# Patient Record
Sex: Male | Born: 1960 | Race: White | Hispanic: No | Smoking: Never smoker
Health system: Southern US, Community
[De-identification: ages and names within clinical notes are randomized; demographics above are authoritative.]

## PROBLEM LIST (undated history)

## (undated) DIAGNOSIS — G4700 Insomnia, unspecified: Secondary | ICD-10-CM

## (undated) DIAGNOSIS — G8929 Other chronic pain: Secondary | ICD-10-CM

## (undated) DIAGNOSIS — R531 Weakness: Secondary | ICD-10-CM

## (undated) DIAGNOSIS — K219 Gastro-esophageal reflux disease without esophagitis: Secondary | ICD-10-CM

## (undated) DIAGNOSIS — I10 Essential (primary) hypertension: Secondary | ICD-10-CM

## (undated) DIAGNOSIS — R51 Headache: Secondary | ICD-10-CM

## (undated) DIAGNOSIS — Z8709 Personal history of other diseases of the respiratory system: Secondary | ICD-10-CM

## (undated) DIAGNOSIS — F32A Depression, unspecified: Secondary | ICD-10-CM

## (undated) DIAGNOSIS — M549 Dorsalgia, unspecified: Secondary | ICD-10-CM

## (undated) DIAGNOSIS — M542 Cervicalgia: Secondary | ICD-10-CM

## (undated) DIAGNOSIS — E785 Hyperlipidemia, unspecified: Secondary | ICD-10-CM

## (undated) DIAGNOSIS — F329 Major depressive disorder, single episode, unspecified: Secondary | ICD-10-CM

---

## 2013-11-11 ENCOUNTER — Encounter (HOSPITAL_COMMUNITY): Payer: Self-pay | Admitting: Pharmacy Technician

## 2013-11-15 ENCOUNTER — Encounter (HOSPITAL_COMMUNITY)
Admission: RE | Admit: 2013-11-15 | Discharge: 2013-11-15 | Disposition: A | Discharge status: Home / Self Care | Payer: Self-pay | Source: Ambulatory Visit | Attending: Anesthesiology | Admitting: Anesthesiology

## 2013-11-15 ENCOUNTER — Encounter (HOSPITAL_COMMUNITY): Payer: Self-pay

## 2013-11-15 ENCOUNTER — Encounter (HOSPITAL_COMMUNITY)
Admission: RE | Admit: 2013-11-15 | Discharge: 2013-11-15 | Disposition: A | Discharge status: Home / Self Care | Payer: Worker's Compensation | Source: Ambulatory Visit | Attending: Orthopedic Surgery | Admitting: Orthopedic Surgery

## 2013-11-15 DIAGNOSIS — Z01811 Encounter for preprocedural respiratory examination: Secondary | ICD-10-CM | POA: Insufficient documentation

## 2013-11-15 DIAGNOSIS — Z01812 Encounter for preprocedural laboratory examination: Secondary | ICD-10-CM | POA: Insufficient documentation

## 2013-11-15 DIAGNOSIS — Z01810 Encounter for preprocedural cardiovascular examination: Secondary | ICD-10-CM | POA: Insufficient documentation

## 2013-11-15 DIAGNOSIS — Z01818 Encounter for other preprocedural examination: Secondary | ICD-10-CM | POA: Insufficient documentation

## 2013-11-15 HISTORY — DX: Other chronic pain: G89.29

## 2013-11-15 HISTORY — DX: Hyperlipidemia, unspecified: E78.5

## 2013-11-15 HISTORY — DX: Depression, unspecified: F32.A

## 2013-11-15 HISTORY — DX: Major depressive disorder, single episode, unspecified: F32.9

## 2013-11-15 HISTORY — DX: Personal history of other diseases of the respiratory system: Z87.09

## 2013-11-15 HISTORY — DX: Headache: R51

## 2013-11-15 HISTORY — DX: Cervicalgia: M54.2

## 2013-11-15 HISTORY — DX: Insomnia, unspecified: G47.00

## 2013-11-15 HISTORY — DX: Gastro-esophageal reflux disease without esophagitis: K21.9

## 2013-11-15 HISTORY — DX: Weakness: R53.1

## 2013-11-15 HISTORY — DX: Dorsalgia, unspecified: M54.9

## 2013-11-15 HISTORY — DX: Essential (primary) hypertension: I10

## 2013-11-15 LAB — CBC
HCT: 45 % (ref 39.0–52.0)
Hemoglobin: 16.7 g/dL (ref 13.0–17.0)
MCH: 34.4 pg — AB (ref 26.0–34.0)
MCHC: 37.1 g/dL — ABNORMAL HIGH (ref 30.0–36.0)
MCV: 92.8 fL (ref 78.0–100.0)
PLATELETS: 186 10*3/uL (ref 150–400)
RBC: 4.85 MIL/uL (ref 4.22–5.81)
RDW: 12.2 % (ref 11.5–15.5)
WBC: 3.3 10*3/uL — ABNORMAL LOW (ref 4.0–10.5)

## 2013-11-15 LAB — BASIC METABOLIC PANEL
BUN: 8 mg/dL (ref 6–23)
CALCIUM: 9.8 mg/dL (ref 8.4–10.5)
CO2: 27 mEq/L (ref 19–32)
Chloride: 99 mEq/L (ref 96–112)
Creatinine, Ser: 0.75 mg/dL (ref 0.50–1.35)
GFR calc non Af Amer: >90 mL/min (ref >90)
GLUCOSE: 106 mg/dL — AB (ref 70–99)
Potassium: 5.2 mEq/L (ref 3.7–5.3)
Sodium: 138 mEq/L (ref 137–147)

## 2013-11-15 LAB — SURGICAL PCR SCREEN
MRSA, PCR: NEGATIVE
Staphylococcus aureus: NEGATIVE

## 2013-11-15 NOTE — Pre-Procedure Instructions (Signed)
Caleb Marshall  11/15/2013   Your procedure is scheduled on:  Thurs, Feb 5 @ 7:30 AM  [facility]  at 5:30 AM.  Call this number if you have problems the morning of surgery: [phone]   Remember:   Do not eat food or drink liquids after midnight.   Take these medicines the morning of surgery with A SIP OF WATER: Amlodipine-Benazepril(Lotrel),Wellbutrin(Bupropion),Citalopram(Celexa),and Omeprazole(Prilosec)              Stop taking your Ibuprofen. No Goody's,BC's,Aspirin,Aleve,Fish Oil,or any Herbal Medications   Do not wear jewelry  Do not wear lotions, powders, or colognes. You may wear deodorant.  Men may shave face and neck.  Do not bring valuables to the hospital.  [facility] is not responsible                  for any belongings or valuables.               Contacts, dentures or bridgework may not be worn into surgery.  Leave suitcase in the car. After surgery it may be brought to your room.  For patients admitted to the hospital, discharge time is determined by your                treatment team.                Special Instructions: Shower using CHG 2 nights before surgery and the night before surgery.  If you shower the day of surgery use CHG.  Use special wash - you have one bottle of CHG for all showers.  You should use approximately 1/3 of the bottle for each shower.   Please read over the following fact sheets that you were given: Pain Booklet, Coughing and Deep Breathing, MRSA Information and Surgical Site Infection Prevention

## 2013-11-15 NOTE — Progress Notes (Signed)
11/15/13 1144  OBSTRUCTIVE SLEEP APNEA  Have you ever been diagnosed with sleep apnea through a sleep study? No  Do you snore loudly (loud enough to be heard through closed doors)?  1  Do you often feel tired, fatigued, or sleepy during the daytime? 0  Has anyone observed you stop breathing during your sleep? 1  Do you have, or are you being treated for high blood pressure? 1  BMI more than 35 kg/m2? 0  Age over 51 years old? 1  Neck circumference greater than 40 cm/18 inches? 0 (17)  Gender: 1  Obstructive Sleep Apnea Score 5  Score 4 or greater  Results sent to PCP

## 2013-11-15 NOTE — Progress Notes (Signed)
Pt doesn't have a cardiologist  Denies ever having an echo/stress test/heart cath  Medical Md is Dr.Gina Drue SecondSnider  Denies EKG or CXR in past yr

## 2013-11-18 NOTE — Progress Notes (Signed)
Anesthesia Chart Review:  Patient is a 53 year old male scheduled for C5-7 ACDF on 11/21/13 by Dr. Shon BatonBrooks.  History includes non-smoker, HTN, HLD, GERD, depression, chronic neck and back pain with hand numbness and LLE tingling, headaches. OSA screening score was a 5. PCP is Bettye BoeckGina Snider, PA-C with [facility].    EKG on 11/15/13 showed SR with first degree AVB, septal infarct (age undetermined).  Q waves in lead III. The interpreting cardiologist wrote that no significant change was found; however, I was unable to locate a comparison EKG in [place], [facility], or at [facility]. He denied prior cardiac testing such as an echo, stress, or cath. No CV symptoms were documented at his PAT visit. He has no reported MI/CHF or DM history.  Preoperative CXR and labs noted.  He will be further evaluated by his assigned anesthesiologist on the day of surgery.  If he remains asymptomatic from a CV standpoint then I would anticipate that he could proceed as planned.  Velna Ochsllison Haillie Radu, PA-C [facility]Anesthesiology Phone [phone] 11/18/2013 1:41 PM

## 2013-11-19 NOTE — H&P (Signed)
History of Present Illness  The patient is a 53 year old male who presents today for follow up of their neck. The patient is being followed for their central (bilateral posterior sides) neck pain. They are 6 month(s) ( and 1/2 months psot injury on 04/27/13) out. Symptoms reported today include: pain (headaches), pain at night, aching, throbbing, stiffness, difficulty arising from chair, pain with overhead motions, weakness, numbness (bilateral hands), burning (bilateral arms), pain with lying and pain with lifting. The patient feels that they are doing poorly and report their pain level to be 5 / 10. Current treatment includes: NSAIDs (Aleve). The following medication has been used for pain control: Tylenol (or Aleve as needed).   Caleb Marshall is a 53 year old white male who is here for neck and left arm pain. Caleb Marshall returns today stating Caleb Marshall symptoms are unchanged from Caleb Marshall previous visit. Caleb Marshall is wanting to proceed with C5-C7 fusion scheduled for next week.     Allergies No Known Drug Allergies. 07/05/2013    Family History Cancer. grandfather mothers side Cerebrovascular Accident. grandmother fathers side Diabetes Mellitus. mother and grandmother mothers side Heart Disease. grandfather mothers side Hypertension. mother, grandmother mothers side and grandfather mothers side    Social History Alcohol use. current drinker; only occasionally per week Children. 3 Current work status. working full time Drug/Alcohol Rehab (Currently). no Drug/Alcohol Rehab (Previously). no Exercise. Exercises rarely Illicit drug use. no Living situation. live alone Marital status. married Pain Contract. no Tobacco / smoke exposure. yes Tobacco use. Never smoker. never smoker    Medication History Atorvastatin Calcium (10MG  Tablet, 1 Oral daily) Active. Citalopram Hydrobromide (40MG  Tablet, 1 Oral daily) Active. Amlodipine Besy-Benazepril HCl (5-20MG  Capsule, 1 Oral  daily) Active. Aleve (220MG  Tablet, 1 Oral as needed) Active. Medications Reconciled.    Vitals 11/15/2013 9:12 AM Weight: 206 lb Height: 70.25 in Body Surface Area: 2.15 m Body Mass Index: 29.35 kg/m Temp.: 98.6 FPulse: 86 (Regular) BP: 133/85 (Sitting, Left Arm, Standard)     Objective Transcription  Caleb Marshall is a pleasant white male. Alert and oriented times three. No acute distress. Head is normocephalic, atraumatic. PERRLA. EOMI. Cervical spine has a good range of motion but with discomfort. Lungs are CTA bilaterally. No wheezing is noted. Heart RRR. No murmurs. Abdomen is round and non-distended. NBS times four. Soft and nontender. Gait is normal.  RADIOGRAPHS:  MRI from 07/05/13 demonstrates no cord signal change. There is motion degredation due to artifact but Caleb Marshall has predominant disease at the C5-6, C6-7 and to a lesser extent C4-5 levels. Caleb Marshall has mild central stenosis at C4-5 with foraminal compromise. Severe bilateral neuroforaminal narrowing at C5-6 and moderate to severe bilateral neuroforaminal narrowing at C6-7 causing irritation to the C5-6, C6-7 nerve roots.   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 Caleb Marshall has failed conservative treatment.  We will proceed with C5-C7 ACDF as scheduled. The surgical procedure as well as potential rehab/ recovery time discussed. All questions were encouraged and answered.  I have had an opportunity to review Caleb Marshall studies with him and review the case. We will be planning on a two level ACDF. Caleb Marshall clinical exam is unchanged. Caleb Marshall continues to have significant neck and radicular arm pain which is now bilateral with the left side being more prominent than the right. Caleb Marshall has an excellent range of motion of Caleb Marshall shoulder, elbow and wrist. Caleb Marshall has numbness and dysesthesia in the C6 & C7 distribution on the  left side. Symmetrical DTR's. Negative motor deficits  but Caleb Marshall does have pain inhibition.  At this point in time, we have gone over the risks of an ACDF to include infection, bleeding, nerve damage, death , stroke, paralysis, failure to heal, need for further surgery, ongoing or worse pain, adjacent segment disease, swallowing difficulties, throat pain, hoarseness in the voice. Because this is a multi-level procedure we will be using an external bone stimulator after surgery for a period of 9-12 months. We will use a hard collar for two weeks and then transition to a soft collar.    Caleb Marshall was present for the dictation. All of Caleb Marshall questions were addressed.

## 2013-11-20 MED ORDER — CEFAZOLIN SODIUM-DEXTROSE 2-3 GM-% IV SOLR
2.0000 g | INTRAVENOUS | Status: AC
Start: 2013-11-20 — End: 2013-11-21
  Administered 2013-11-21: 2 g via INTRAVENOUS
  Filled 2013-11-20: qty 50

## 2013-11-20 MED ORDER — ACETAMINOPHEN 10 MG/ML IV SOLN
1000.0000 mg | Freq: Four times a day (QID) | INTRAVENOUS | Status: DC
  Administered 2013-11-21: 1000 mg via INTRAVENOUS
  Filled 2013-11-20: qty 100

## 2013-11-20 MED ORDER — DEXAMETHASONE SODIUM PHOSPHATE 4 MG/ML IJ SOLN
4.0000 mg | Freq: Once | INTRAMUSCULAR | Status: AC
  Administered 2013-11-21: 4 mg via INTRAVENOUS
  Filled 2013-11-20: qty 1

## 2013-11-21 ENCOUNTER — Encounter (HOSPITAL_COMMUNITY): Payer: Self-pay | Admitting: *Deleted

## 2013-11-21 ENCOUNTER — Observation Stay (HOSPITAL_COMMUNITY): Payer: Worker's Compensation

## 2013-11-21 ENCOUNTER — Encounter (HOSPITAL_COMMUNITY)
Admission: RE | Disposition: A | Discharge status: Home Health | Payer: Self-pay | Source: Ambulatory Visit | Attending: Orthopedic Surgery

## 2013-11-21 ENCOUNTER — Encounter (HOSPITAL_COMMUNITY): Payer: Worker's Compensation | Admitting: Vascular Surgery

## 2013-11-21 ENCOUNTER — Ambulatory Visit (HOSPITAL_COMMUNITY): Payer: Worker's Compensation

## 2013-11-21 ENCOUNTER — Observation Stay (HOSPITAL_COMMUNITY)
Admission: RE | Admit: 2013-11-21 | Discharge: 2013-11-22 | Disposition: A | Discharge status: Home Health | Payer: Worker's Compensation | Source: Ambulatory Visit | Attending: Orthopedic Surgery | Admitting: Orthopedic Surgery

## 2013-11-21 ENCOUNTER — Ambulatory Visit (HOSPITAL_COMMUNITY): Payer: Worker's Compensation | Admitting: Anesthesiology

## 2013-11-21 DIAGNOSIS — M549 Dorsalgia, unspecified: Secondary | ICD-10-CM | POA: Insufficient documentation

## 2013-11-21 DIAGNOSIS — E785 Hyperlipidemia, unspecified: Secondary | ICD-10-CM | POA: Insufficient documentation

## 2013-11-21 DIAGNOSIS — K219 Gastro-esophageal reflux disease without esophagitis: Secondary | ICD-10-CM | POA: Insufficient documentation

## 2013-11-21 DIAGNOSIS — G4700 Insomnia, unspecified: Secondary | ICD-10-CM | POA: Insufficient documentation

## 2013-11-21 DIAGNOSIS — M542 Cervicalgia: Secondary | ICD-10-CM | POA: Diagnosis present

## 2013-11-21 DIAGNOSIS — G8929 Other chronic pain: Secondary | ICD-10-CM | POA: Insufficient documentation

## 2013-11-21 DIAGNOSIS — I10 Essential (primary) hypertension: Secondary | ICD-10-CM | POA: Insufficient documentation

## 2013-11-21 DIAGNOSIS — M47812 Spondylosis without myelopathy or radiculopathy, cervical region: Principal | ICD-10-CM | POA: Insufficient documentation

## 2013-11-21 DIAGNOSIS — F3289 Other specified depressive episodes: Secondary | ICD-10-CM | POA: Insufficient documentation

## 2013-11-21 DIAGNOSIS — F329 Major depressive disorder, single episode, unspecified: Secondary | ICD-10-CM | POA: Insufficient documentation

## 2013-11-21 HISTORY — PX: ANTERIOR CERVICAL DECOMP/DISCECTOMY FUSION: SHX1161

## 2013-11-21 HISTORY — PX: CERVICAL FUSION: SHX112

## 2013-11-21 SURGERY — ANTERIOR CERVICAL DECOMPRESSION/DISCECTOMY FUSION 2 LEVELS
Anesthesia: General

## 2013-11-21 MED ORDER — THROMBIN 20000 UNITS EX SOLR
CUTANEOUS | Status: AC
  Filled 2013-11-21: qty 20000

## 2013-11-21 MED ORDER — SODIUM CHLORIDE 0.9 % IJ SOLN: 3.0000 mL | INTRAMUSCULAR | Status: DC | PRN

## 2013-11-21 MED ORDER — PROMETHAZINE HCL 25 MG/ML IJ SOLN: 6.2500 mg | INTRAMUSCULAR | Status: DC | PRN

## 2013-11-21 MED ORDER — ONDANSETRON HCL 4 MG/2ML IJ SOLN: 4.0000 mg | INTRAMUSCULAR | Status: DC | PRN

## 2013-11-21 MED ORDER — PHENOL 1.4 % MT LIQD
1.0000 | OROMUCOSAL | Status: DC | PRN
  Administered 2013-11-21: 1 via OROMUCOSAL
  Filled 2013-11-21: qty 177

## 2013-11-21 MED ORDER — MEPERIDINE HCL 25 MG/ML IJ SOLN: 6.2500 mg | INTRAMUSCULAR | Status: DC | PRN

## 2013-11-21 MED ORDER — THROMBIN 20000 UNITS EX KIT
PACK | CUTANEOUS | Status: DC | PRN
  Administered 2013-11-21: 20000 [IU] via TOPICAL

## 2013-11-21 MED ORDER — PROPOFOL 10 MG/ML IV BOLUS
INTRAVENOUS | Status: AC
  Filled 2013-11-21: qty 20

## 2013-11-21 MED ORDER — FENTANYL CITRATE 0.05 MG/ML IJ SOLN
INTRAMUSCULAR | Status: DC | PRN
  Administered 2013-11-21 (×5): 50 ug via INTRAVENOUS
  Administered 2013-11-21: 250 ug via INTRAVENOUS

## 2013-11-21 MED ORDER — CITALOPRAM HYDROBROMIDE 40 MG PO TABS
40.0000 mg | ORAL_TABLET | Freq: Every day | ORAL | Status: DC
  Administered 2013-11-21 – 2013-11-22 (×2): 40 mg via ORAL
  Filled 2013-11-21 (×2): qty 1

## 2013-11-21 MED ORDER — EPHEDRINE SULFATE 50 MG/ML IJ SOLN
INTRAMUSCULAR | Status: AC
  Filled 2013-11-21: qty 1

## 2013-11-21 MED ORDER — LACTATED RINGERS IV SOLN
INTRAVENOUS | Status: DC | PRN
  Administered 2013-11-21 (×2): via INTRAVENOUS

## 2013-11-21 MED ORDER — GLYCOPYRROLATE 0.2 MG/ML IJ SOLN
INTRAMUSCULAR | Status: AC
  Filled 2013-11-21: qty 4

## 2013-11-21 MED ORDER — MENTHOL 3 MG MT LOZG: 1.0000 | LOZENGE | OROMUCOSAL | Status: DC | PRN

## 2013-11-21 MED ORDER — ROCURONIUM BROMIDE 100 MG/10ML IV SOLN
INTRAVENOUS | Status: DC | PRN
  Administered 2013-11-21: 50 mg via INTRAVENOUS
  Administered 2013-11-21 (×2): 10 mg via INTRAVENOUS
  Administered 2013-11-21: 20 mg via INTRAVENOUS

## 2013-11-21 MED ORDER — OXYCODONE HCL 5 MG PO TABS: 5.0000 mg | ORAL_TABLET | Freq: Once | ORAL | Status: DC | PRN

## 2013-11-21 MED ORDER — STERILE WATER FOR INJECTION IJ SOLN
INTRAMUSCULAR | Status: AC
  Filled 2013-11-21: qty 10

## 2013-11-21 MED ORDER — GLYCOPYRROLATE 0.2 MG/ML IJ SOLN
INTRAMUSCULAR | Status: DC | PRN
  Administered 2013-11-21: .8 mg via INTRAVENOUS

## 2013-11-21 MED ORDER — BUPROPION HCL ER (XL) 150 MG PO TB24
150.0000 mg | ORAL_TABLET | Freq: Every day | ORAL | Status: DC
  Administered 2013-11-21 – 2013-11-22 (×2): 150 mg via ORAL
  Filled 2013-11-21 (×2): qty 1

## 2013-11-21 MED ORDER — MIDAZOLAM HCL 2 MG/2ML IJ SOLN
INTRAMUSCULAR | Status: AC
  Filled 2013-11-21: qty 2

## 2013-11-21 MED ORDER — DEXAMETHASONE 4 MG PO TABS
4.0000 mg | ORAL_TABLET | Freq: Four times a day (QID) | ORAL | Status: DC
  Administered 2013-11-22: 4 mg via ORAL
  Filled 2013-11-21 (×7): qty 1

## 2013-11-21 MED ORDER — LIDOCAINE HCL (CARDIAC) 20 MG/ML IV SOLN
INTRAVENOUS | Status: DC | PRN
  Administered 2013-11-21: 40 mg via INTRAVENOUS

## 2013-11-21 MED ORDER — NEOSTIGMINE METHYLSULFATE 1 MG/ML IJ SOLN
INTRAMUSCULAR | Status: DC | PRN
  Administered 2013-11-21: 5 mg via INTRAVENOUS

## 2013-11-21 MED ORDER — ONDANSETRON HCL 4 MG/2ML IJ SOLN
INTRAMUSCULAR | Status: DC | PRN
  Administered 2013-11-21: 4 mg via INTRAVENOUS

## 2013-11-21 MED ORDER — MIDAZOLAM HCL 5 MG/5ML IJ SOLN
INTRAMUSCULAR | Status: DC | PRN
  Administered 2013-11-21: 2 mg via INTRAVENOUS

## 2013-11-21 MED ORDER — LIDOCAINE HCL (CARDIAC) 20 MG/ML IV SOLN
INTRAVENOUS | Status: AC
  Filled 2013-11-21: qty 5

## 2013-11-21 MED ORDER — ROCURONIUM BROMIDE 50 MG/5ML IV SOLN
INTRAVENOUS | Status: AC
  Filled 2013-11-21: qty 1

## 2013-11-21 MED ORDER — SODIUM CHLORIDE 0.9 % IJ SOLN
3.0000 mL | Freq: Two times a day (BID) | INTRAMUSCULAR | Status: DC
  Administered 2013-11-21: 3 mL via INTRAVENOUS

## 2013-11-21 MED ORDER — DEXAMETHASONE SODIUM PHOSPHATE 4 MG/ML IJ SOLN
4.0000 mg | Freq: Four times a day (QID) | INTRAMUSCULAR | Status: DC
  Administered 2013-11-21 – 2013-11-22 (×3): 4 mg via INTRAVENOUS
  Filled 2013-11-21 (×7): qty 1

## 2013-11-21 MED ORDER — AMLODIPINE BESYLATE 5 MG PO TABS
5.0000 mg | ORAL_TABLET | Freq: Every day | ORAL | Status: DC
  Administered 2013-11-21 – 2013-11-22 (×2): 5 mg via ORAL
  Filled 2013-11-21 (×2): qty 1

## 2013-11-21 MED ORDER — CEFAZOLIN SODIUM 1-5 GM-% IV SOLN
1.0000 g | Freq: Three times a day (TID) | INTRAVENOUS | Status: AC
  Administered 2013-11-21 – 2013-11-22 (×2): 1 g via INTRAVENOUS
  Filled 2013-11-21 (×2): qty 50

## 2013-11-21 MED ORDER — OXYCODONE HCL 5 MG PO TABS
10.0000 mg | ORAL_TABLET | ORAL | Status: DC | PRN
  Administered 2013-11-22: 10 mg via ORAL
  Filled 2013-11-21: qty 2

## 2013-11-21 MED ORDER — MORPHINE SULFATE 2 MG/ML IJ SOLN: 1.0000 mg | INTRAMUSCULAR | Status: DC | PRN

## 2013-11-21 MED ORDER — ARTIFICIAL TEARS OP OINT
TOPICAL_OINTMENT | OPHTHALMIC | Status: DC | PRN
  Administered 2013-11-21: 1 via OPHTHALMIC

## 2013-11-21 MED ORDER — SODIUM CHLORIDE 0.9 % IR SOLN
Status: DC | PRN
  Administered 2013-11-21: 1000 mL

## 2013-11-21 MED ORDER — ONDANSETRON HCL 4 MG/2ML IJ SOLN
INTRAMUSCULAR | Status: AC
  Filled 2013-11-21: qty 2

## 2013-11-21 MED ORDER — LACTATED RINGERS IV SOLN
Freq: Once | INTRAVENOUS | Status: AC
  Administered 2013-11-21: 250 mL via INTRAVENOUS

## 2013-11-21 MED ORDER — FENTANYL CITRATE 0.05 MG/ML IJ SOLN
INTRAMUSCULAR | Status: AC
  Filled 2013-11-21: qty 5

## 2013-11-21 MED ORDER — ACETAMINOPHEN 10 MG/ML IV SOLN
1000.0000 mg | Freq: Four times a day (QID) | INTRAVENOUS | Status: AC
  Administered 2013-11-21 – 2013-11-22 (×4): 1000 mg via INTRAVENOUS
  Filled 2013-11-21 (×4): qty 100

## 2013-11-21 MED ORDER — PROPOFOL 10 MG/ML IV BOLUS
INTRAVENOUS | Status: DC | PRN
  Administered 2013-11-21: 200 mg via INTRAVENOUS

## 2013-11-21 MED ORDER — BUPIVACAINE-EPINEPHRINE 0.25% -1:200000 IJ SOLN
INTRAMUSCULAR | Status: DC | PRN
  Administered 2013-11-21: 7 mL

## 2013-11-21 MED ORDER — OXYCODONE HCL 5 MG/5ML PO SOLN: 5.0000 mg | Freq: Once | ORAL | Status: DC | PRN

## 2013-11-21 MED ORDER — BENAZEPRIL HCL 20 MG PO TABS
20.0000 mg | ORAL_TABLET | Freq: Every day | ORAL | Status: DC
  Administered 2013-11-21 – 2013-11-22 (×2): 20 mg via ORAL
  Filled 2013-11-21 (×2): qty 1

## 2013-11-21 MED ORDER — ZOLPIDEM TARTRATE 5 MG PO TABS: 5.0000 mg | ORAL_TABLET | Freq: Every evening | ORAL | Status: DC | PRN

## 2013-11-21 MED ORDER — FENTANYL CITRATE 0.05 MG/ML IJ SOLN
INTRAMUSCULAR | Status: AC
Start: 2013-11-21 — End: 2013-11-21
  Filled 2013-11-21: qty 5

## 2013-11-21 MED ORDER — ATORVASTATIN CALCIUM 10 MG PO TABS
10.0000 mg | ORAL_TABLET | Freq: Every day | ORAL | Status: DC
  Administered 2013-11-21 – 2013-11-22 (×2): 10 mg via ORAL
  Filled 2013-11-21 (×2): qty 1

## 2013-11-21 MED ORDER — ARTIFICIAL TEARS OP OINT
TOPICAL_OINTMENT | OPHTHALMIC | Status: AC
  Filled 2013-11-21: qty 3.5

## 2013-11-21 MED ORDER — LACTATED RINGERS IV SOLN: INTRAVENOUS | Status: DC

## 2013-11-21 MED ORDER — METHOCARBAMOL 500 MG PO TABS
500.0000 mg | ORAL_TABLET | Freq: Four times a day (QID) | ORAL | Status: DC | PRN
  Filled 2013-11-21: qty 1

## 2013-11-21 MED ORDER — HYDROMORPHONE HCL PF 1 MG/ML IJ SOLN
INTRAMUSCULAR | Status: AC
  Filled 2013-11-21: qty 1

## 2013-11-21 MED ORDER — MIDAZOLAM HCL 2 MG/2ML IJ SOLN: 0.5000 mg | Freq: Once | INTRAMUSCULAR | Status: DC | PRN

## 2013-11-21 MED ORDER — AMLODIPINE BESY-BENAZEPRIL HCL 5-20 MG PO CAPS: 1.0000 | ORAL_CAPSULE | Freq: Every day | ORAL | Status: DC

## 2013-11-21 MED ORDER — SODIUM CHLORIDE 0.9 % IV SOLN: 250.0000 mL | INTRAVENOUS | Status: DC

## 2013-11-21 MED ORDER — HYDROMORPHONE HCL PF 1 MG/ML IJ SOLN
0.2500 mg | INTRAMUSCULAR | Status: DC | PRN
  Administered 2013-11-21 (×4): 0.5 mg via INTRAVENOUS

## 2013-11-21 MED ORDER — BUPIVACAINE-EPINEPHRINE (PF) 0.25% -1:200000 IJ SOLN
INTRAMUSCULAR | Status: AC
  Filled 2013-11-21: qty 30

## 2013-11-21 MED ORDER — METHOCARBAMOL 100 MG/ML IJ SOLN
500.0000 mg | Freq: Four times a day (QID) | INTRAVENOUS | Status: DC | PRN
  Administered 2013-11-21: 500 mg via INTRAVENOUS
  Filled 2013-11-21: qty 5

## 2013-11-21 MED ORDER — NEOSTIGMINE METHYLSULFATE 1 MG/ML IJ SOLN
INTRAMUSCULAR | Status: AC
  Filled 2013-11-21: qty 10

## 2013-11-21 SURGICAL SUPPLY — 59 items
BLADE SURG ROTATE 9660 (MISCELLANEOUS) IMPLANT
BUR EGG ELITE 4.0 (BURR) IMPLANT
BUR EGG ELITE 4.0MM (BURR)
BUR MATCHSTICK NEURO 3.0 LAGG (BURR) ×3 IMPLANT
CANISTER SUCTION 2500CC (MISCELLANEOUS) ×3 IMPLANT
CLOSURE STERI-STRIP 1/2X4 (GAUZE/BANDAGES/DRESSINGS) ×1
CLOTH BEACON ORANGE TIMEOUT ST (SAFETY) ×3 IMPLANT
CLSR STERI-STRIP ANTIMIC 1/2X4 (GAUZE/BANDAGES/DRESSINGS) ×2 IMPLANT
CORDS BIPOLAR (ELECTRODE) ×3 IMPLANT
COVER SURGICAL LIGHT HANDLE (MISCELLANEOUS) ×6 IMPLANT
CRADLE DONUT ADULT HEAD (MISCELLANEOUS) ×3 IMPLANT
DRAPE C-ARM 42X72 X-RAY (DRAPES) ×3 IMPLANT
DRAPE POUCH INSTRU U-SHP 10X18 (DRAPES) ×3 IMPLANT
DRAPE SURG 17X23 STRL (DRAPES) ×3 IMPLANT
DRAPE U-SHAPE 47X51 STRL (DRAPES) ×3 IMPLANT
DRSG MEPILEX BORDER 4X4 (GAUZE/BANDAGES/DRESSINGS) ×3 IMPLANT
DURAPREP 26ML APPLICATOR (WOUND CARE) ×3 IMPLANT
ELECT COATED BLADE 2.86 ST (ELECTRODE) ×3 IMPLANT
ELECT REM PT RETURN 9FT ADLT (ELECTROSURGICAL) ×3
ELECTRODE REM PT RTRN 9FT ADLT (ELECTROSURGICAL) ×1 IMPLANT
ENDOSKELETON LG TC 6VBR 8MM (Orthopedic Implant) ×6 IMPLANT
GLOVE BIOGEL PI IND STRL 8 (GLOVE) ×1 IMPLANT
GLOVE BIOGEL PI IND STRL 8.5 (GLOVE) ×1 IMPLANT
GLOVE BIOGEL PI INDICATOR 8 (GLOVE) ×2
GLOVE BIOGEL PI INDICATOR 8.5 (GLOVE) ×2
GLOVE ECLIPSE 8.5 STRL (GLOVE) ×3 IMPLANT
GLOVE ORTHO TXT STRL SZ7.5 (GLOVE) ×3 IMPLANT
GOWN STRL REIN 2XL XLG LVL4 (GOWN DISPOSABLE) ×3 IMPLANT
GOWN STRL REIN XL XLG (GOWN DISPOSABLE) ×6 IMPLANT
GOWN STRL REUS W/TWL 2XL LVL3 (GOWN DISPOSABLE) ×3 IMPLANT
KIT BASIN OR (CUSTOM PROCEDURE TRAY) ×3 IMPLANT
KIT ROOM TURNOVER OR (KITS) ×3 IMPLANT
NEEDLE SPNL 18GX3.5 QUINCKE PK (NEEDLE) ×3 IMPLANT
NS IRRIG 1000ML POUR BTL (IV SOLUTION) ×3 IMPLANT
PACK ORTHO CERVICAL (CUSTOM PROCEDURE TRAY) ×3 IMPLANT
PACK UNIVERSAL I (CUSTOM PROCEDURE TRAY) ×3 IMPLANT
PAD ARMBOARD 7.5X6 YLW CONV (MISCELLANEOUS) ×6 IMPLANT
PATTIES SURGICAL .25X.25 (GAUZE/BANDAGES/DRESSINGS) ×3 IMPLANT
PLATE SKYLINE TWO LEVEL 32MM (Plate) ×3 IMPLANT
PUTTY BONE DBX 5CC MIX (Putty) ×3 IMPLANT
RESTRAINT LIMB HOLDER UNIV (RESTRAINTS) ×3 IMPLANT
SCREW SKYLINE 14MM SD-VA (Screw) ×12 IMPLANT
SCREW SKYLINE 16MM (Screw) ×6 IMPLANT
SPONGE INTESTINAL PEANUT (DISPOSABLE) ×3 IMPLANT
SPONGE LAP 4X18 X RAY DECT (DISPOSABLE) ×6 IMPLANT
SPONGE SURGIFOAM ABS GEL 100 (HEMOSTASIS) ×3 IMPLANT
SURGIFLO TRUKIT (HEMOSTASIS) IMPLANT
SUT MON AB 3-0 SH 27 (SUTURE) ×2
SUT MON AB 3-0 SH27 (SUTURE) ×1 IMPLANT
SUT SILK 2 0 (SUTURE)
SUT SILK 2-0 18XBRD TIE 12 (SUTURE) IMPLANT
SUT VIC AB 2-0 CT1 18 (SUTURE) ×3 IMPLANT
SYR BULB IRRIGATION 50ML (SYRINGE) ×3 IMPLANT
SYR CONTROL 10ML LL (SYRINGE) ×3 IMPLANT
TAPE CLOTH 4X10 WHT NS (GAUZE/BANDAGES/DRESSINGS) ×3 IMPLANT
TAPE UMBILICAL COTTON 1/8X30 (MISCELLANEOUS) ×3 IMPLANT
TOWEL OR 17X24 6PK STRL BLUE (TOWEL DISPOSABLE) ×3 IMPLANT
TOWEL OR 17X26 10 PK STRL BLUE (TOWEL DISPOSABLE) ×3 IMPLANT
WATER STERILE IRR 1000ML POUR (IV SOLUTION) ×3 IMPLANT

## 2013-11-21 NOTE — H&P (Signed)
No change in clinical exam H+P reviewed  

## 2013-11-21 NOTE — Anesthesia Preprocedure Evaluation (Signed)
Anesthesia Evaluation  Patient identified by MRN, date of birth, ID band Patient awake    Reviewed: Allergy & Precautions, H&P , NPO status , Patient's Chart, lab work & pertinent test results  History of Anesthesia Complications Negative for: history of anesthetic complications  Airway Mallampati: II TM Distance: >3 FB Neck ROM: Full    Dental  (+) Teeth Intact and Dental Advisory Given   Pulmonary neg pulmonary ROS,  breath sounds clear to auscultation  Pulmonary exam normal       Cardiovascular hypertension, Pt. on medications Rhythm:Regular Rate:Normal     Neuro/Psych    GI/Hepatic Neg liver ROS, GERD-  Medicated and Controlled,  Endo/Other  negative endocrine ROS  Renal/GU negative Renal ROS     Musculoskeletal   Abdominal   Peds  Hematology negative hematology ROS (+)   Anesthesia Other Findings   Reproductive/Obstetrics                           Anesthesia Physical Anesthesia Plan  ASA: II  Anesthesia Plan: General   Post-op Pain Management:    Induction: Intravenous  Airway Management Planned: Oral ETT  Additional Equipment:   Intra-op Plan:   Post-operative Plan: Extubation in OR  Informed Consent: I have reviewed the patients History and Physical, chart, labs and discussed the procedure including the risks, benefits and alternatives for the proposed anesthesia with the patient or authorized representative who has indicated his/her understanding and acceptance.   Dental advisory given  Plan Discussed with: CRNA and Surgeon  Anesthesia Plan Comments: (Plan routine monitors, GETA)        Anesthesia Quick Evaluation

## 2013-11-21 NOTE — Anesthesia Postprocedure Evaluation (Signed)
  Anesthesia Post-op Note  Patient: Caleb Marshall  Procedure(s) Performed: Procedure(s): ANTERIOR CERVICAL DECOMPRESSION/DISCECTOMY FUSION  C-5 C-7    (2 LEVELS)  (N/A)  Patient Location: PACU  Anesthesia Type:General  Level of Consciousness: awake, alert , oriented and patient cooperative  Airway and Oxygen Therapy: Patient Spontanous Breathing and Patient connected to nasal cannula oxygen  Post-op Pain: none  Post-op Assessment: Post-op Vital signs reviewed, Patient's Cardiovascular Status Stable, Respiratory Function Stable, Patent Airway, No signs of Nausea or vomiting and Pain level controlled  Post-op Vital Signs: Reviewed and stable  Complications: No apparent anesthesia complications

## 2013-11-21 NOTE — Brief Op Note (Signed)
11/21/2013  10:41 AM  PATIENT:  Caleb Marshall  53 y.o. male  PRE-OPERATIVE DIAGNOSIS:  CERVICAL SPONDYLOTIC RADICLOPATHY   POST-OPERATIVE DIAGNOSIS:  CERVICAL SPONDYLOTIC RADICLOPATHY   PROCEDURE:  Procedure(s): ANTERIOR CERVICAL DECOMPRESSION/DISCECTOMY FUSION  C-5 C-7    (2 LEVELS)  (N/A)  SURGEON:  Surgeon(s) and Role:    * Venita Lickahari Kanyon Seibold, MD - Primary  PHYSICIAN ASSISTANT:   ASSISTANTS: Zonia Kiefjames owens    ANESTHESIA:   general  EBL:  Total I/O In: 1000 [I.V.:1000] Out: -   BLOOD ADMINISTERED:none  DRAINS: none   LOCAL MEDICATIONS USED:  MARCAINE     SPECIMEN:  No Specimen  DISPOSITION OF SPECIMEN:  N/A  COUNTS:  YES  TOURNIQUET:  * No tourniquets in log *  DICTATION: .Other Dictation: Dictation Number [id number]  PLAN OF CARE: Admit for overnight observation  PATIENT DISPOSITION:  PACU - hemodynamically stable.

## 2013-11-21 NOTE — Anesthesia Procedure Notes (Signed)
Procedure Name: Intubation Date/Time: 11/21/2013 7:48 AM Performed by: Reine JustFLOWERS, Elyza Whitt T Pre-anesthesia Checklist: Patient identified, Timeout performed, Emergency Drugs available, Suction available and Patient being monitored Patient Re-evaluated:Patient Re-evaluated prior to inductionOxygen Delivery Method: Circle system utilized and Simple face mask Preoxygenation: Pre-oxygenation with 100% oxygen Intubation Type: IV induction Ventilation: Oral airway inserted - appropriate to patient size Grade View: Grade I Tube size: 8.0 mm Number of attempts: 1 Airway Equipment and Method: Patient positioned with wedge pillow,  Stylet and Video-laryngoscopy Placement Confirmation: ETT inserted through vocal cords under direct vision,  positive ETCO2 and breath sounds checked- equal and bilateral Secured at: 22 cm Tube secured with: Tape Dental Injury: Teeth and Oropharynx as per pre-operative assessment  Comments: Elective use of glidescope due to pain with neck mobility

## 2013-11-21 NOTE — Transfer of Care (Signed)
Immediate Anesthesia Transfer of Care Note  Patient: Caleb Marshall  Procedure(s) Performed: Procedure(s): ANTERIOR CERVICAL DECOMPRESSION/DISCECTOMY FUSION  C-5 C-7    (2 LEVELS)  (N/A)  Patient Location: PACU  Anesthesia Type:General  Level of Consciousness: awake and alert   Airway & Oxygen Therapy: Patient Spontanous Breathing and Patient connected to face mask oxygen  Post-op Assessment: Report given to PACU RN, Post -op Vital signs reviewed and stable and Patient moving all extremities X 4  Post vital signs: Reviewed and stable  Complications: No apparent anesthesia complications

## 2013-11-21 NOTE — Preoperative (Signed)
Beta Blockers   Reason not to administer Beta Blockers:Not Applicable 

## 2013-11-22 ENCOUNTER — Encounter (HOSPITAL_COMMUNITY): Payer: Self-pay | Admitting: General Practice

## 2013-11-22 MED ORDER — METHOCARBAMOL 500 MG PO TABS: 500.0000 mg | ORAL_TABLET | Freq: Four times a day (QID) | ORAL | Status: AC | PRN

## 2013-11-22 MED ORDER — ONDANSETRON 4 MG PO TBDP: 4.0000 mg | ORAL_TABLET | Freq: Three times a day (TID) | ORAL | Status: AC | PRN

## 2013-11-22 MED ORDER — POLYETHYLENE GLYCOL 3350 17 G PO PACK: 17.0000 g | PACK | Freq: Every day | ORAL | Status: AC

## 2013-11-22 MED ORDER — DOCUSATE SODIUM 100 MG PO CAPS: 100.0000 mg | ORAL_CAPSULE | Freq: Two times a day (BID) | ORAL | Status: AC

## 2013-11-22 MED ORDER — OXYCODONE-ACETAMINOPHEN 5-325 MG PO TABS: 1.0000 | ORAL_TABLET | Freq: Four times a day (QID) | ORAL | Status: AC | PRN

## 2013-11-22 NOTE — Op Note (Signed)
Caleb Marshall, Caleb Marshall              ACCOUNT NO.:  [id number]  MEDICAL RECORD NO.:  [id number]  LOCATION:  [id number]                        FACILITY:  [facility]  PHYSICIAN:  Alvy Bealahari D Florencia Zaccaro, MD    DATE OF BIRTH:  1960/11/13  DATE OF PROCEDURE:  11/21/2013 DATE OF DISCHARGE:                              OPERATIVE REPORT   PREOPERATIVE DIAGNOSIS:  Cervical spondylitic radiculopathy, C5-6, C6-7.  FIRST ASSISTANT:  Genene ChurnJames M. Denton Meekwens, P.A.  HISTORY:  This is a very pleasant gentleman who has been complaining of significant neck and bilateral arm pain, left greater than right for some time.  Injection therapy helped to localize the levels of symptoms that were causing his pain.  After discussing the risks, benefits, and alternatives to surgery, he elected to proceed with ACDF.  All appropriate risks, benefits, and alternatives were discussed with the patient, and consent was obtained.  OPERATIVE NOTE:  The patient was brought to the operating room, placed supine on the operating table.  After successful induction of general anesthesia endotracheal intubation, TEDs, SCDs were applied, and the patient was placed supine on the operative table.  Inflatable cuff was placed between the shoulder blades.  After anesthesia, the patient was prepped and draped in a standard fashion.  All bony prominences were well padded.  Time-out was taken, confirming the patient, procedure, and all other pertinent important data.  Once this was completed, I then used x-ray to identify the C6 vertebral body.  Once this was done, I infiltrated the incision site and then made a transverse incision, starting at the midline, proceeding to the left.  Sharp dissection was carried out down to the platysma, and the platysma was sharply incised, and I started dissecting using a standard Smith-Robinson approach to the anterior cervical spine.  I proceeded on the medial border of sternocleidomastoid.  I proceeded downwards,  identified the omohyoid muscle and sacrificed for retraction and visualization.  At this point in time, I was able to bluntly dissect through the remainder of the deep cervical and prevertebral fascia to expose the anterior longitudinal ligament.  Once I had confirmed the midbody of C5 to the midbody of C7 identified, I placed a needle into the C5-6 disk space and took an x-ray.  I confirmed that this was the appropriate level.  Once this was done, I marked the level with the Bovie, and then began mobilizing longus coli muscles from the midbody of C5 to the midbody of C7.  I then placed self-retaining retractors underneath the longus colli muscle and took down the endotracheal cuff, expanded the retractors, and reinflated the endotracheal cuff.  An annulotomy was performed at the C6-C7 disk space and using a combination of pituitary rongeurs and Kerrison rongeurs, I removed the bulk of the disk material.  I then placed distraction pins into the bodies of C6 and C7 and then used interlaminar spreader to distract the disk space and then maintained it with the distraction pins.  Once this was done, I continued dissecting posteriorly until I was at the annulus. I released the annulus.  I then used a high-speed burr to trim down the posterior osteophyte from the inferior aspect of the C7 and C6  vertebral body.  I then used a 1-mm Kerrison to continue to trim this down and released the posterior annulus in its entirety.  I then developed a plane underneath the posterior longitudinal ligament and resected this with a 1-mm Kerrison.  At this point, I was able to get underneath the uncovertebral joint and decompress both sides as well.  I then rasped the endplates to ensure I had bleeding subchondral bone.  I irrigated copiously with normal saline and used my nerve hook to check the decompression both at the uncovertebral joints and underneath the vertebral bodies.  Once I was satisfied with  this, I measured with trial devices and placed an 8 large Titan titanium intervertebral spacer, packed with DBX mix, this got excellent purchase.  I then removed the distraction pin from C7 and placed it in C5, distracted the C5-6 disk space and using the same technique, I did at C6-7, performed a C5-6 diskectomy.  Again, I released the posterior longitudinal ligament and made sure I had an adequate decompression underneath the uncovertebral joints.  I then rasped the endplates and used the same size implant as I did at C6-7 at this level.  Once both interbody cages were positioned, I then applied a 32-mm anterior cervical DePuy Synthes plate and affixed it into the bodies of C5 with 16-mm screws and into the bodies of C6 and C7 with 14-mm screws.  All screws had excellent purchase and they were torqued down according to manufacturer's standard.  I then removed all the remaining retracting devices and checked it to ensure I had hemostasis and that the esophagus did not become entrap beneath the plate.  Once I was confirmed this, I irrigated copiously with normal saline, returned the trachea in esophagus to midline, and closed the platysma with interrupted 2-0 Vicryl sutures.  I then closed the platysma with interrupted 2-0 Vicryl sutures and the skin with 3-0 Monocryl.  At the end of the case, all needle and sponge counts were correct.  There were no adverse intraoperative events.  First assistant was Zonia Kief, he was instrumental in providing retraction, suction, irrigation, and wound closure.     Alvy Beal, MD     DDB/MEDQ  D:  11/21/2013  T:  11/22/2013  Job:  [id number]

## 2013-11-22 NOTE — Progress Notes (Signed)
Patient ID: Caleb Marshall, male   DOB: [birth year], 53 y.o.   MRN: [id number]    Subjective: 1 Day Post-Op Procedure(s) (LRB): ANTERIOR CERVICAL DECOMPRESSION/DISCECTOMY FUSION  C-5 C-7    (2 LEVELS)  (N/A) Patient reports pain as mild.   Denies CP or SOB.  Voiding without difficulty. Positive flatus. Objective: Vital signs in last 24 hours: Temp:  [97.1 F (36.2 C)-98.8 F (37.1 C)] 97.3 F (36.3 C) (02/06 0428) Pulse Rate:  [73-124] 85 (02/06 0428) Resp:  [12-18] 16 (02/06 0428) BP: (134-165)/(76-99) 139/88 mmHg (02/06 0428) SpO2:  [94 %-99 %] 97 % (02/06 0428)  Intake/Output from previous day: 02/05 0701 - 02/06 0700 In: 2620 [P.O.:600; I.V.:2020] Out: -  Intake/Output this shift: Total I/O In: 120 [P.O.:120] Out: -   Labs: No results found for this basename: HGB,  in the last 72 hours No results found for this basename: WBC, RBC, HCT, PLT,  in the last 72 hours No results found for this basename: NA, K, CL, CO2, BUN, CREATININE, GLUCOSE, CALCIUM,  in the last 72 hours No results found for this basename: LABPT, INR,  in the last 72 hours  Physical Exam: Neurologically intact  Assessment/Plan: 1 Day Post-Op Procedure(s) (LRB): ANTERIOR CERVICAL DECOMPRESSION/DISCECTOMY FUSION  C-5 C-7    (2 LEVELS)  (N/A) Advance diet Discharge home with home health  Kenaz Olafson M for Dr. Venita Lickahari Brooks [facility] [phone] 11/22/2013, 1:08 PM

## 2013-11-22 NOTE — Discharge Instructions (Signed)

## 2013-11-22 NOTE — Progress Notes (Signed)
11/22/13 No home health follow up recommended  and no equipment needs identified per PT/OT evals. Jacquelynn CreeMary Sedonia Kitner RN, BSN CCM

## 2013-11-22 NOTE — Evaluation (Signed)
Physical Therapy Evaluation Patient Details Name: Caleb Marshall MRN: [id number] DOB: 1960-10-23 Today's Date: 11/22/2013 Time: [id number] PT Time Calculation (min): 15 min  PT Assessment / Plan / Recommendation History of Present Illness  pt admitted for ANTERIOR CERVICAL DECOMPRESSION/DISCECTOMY FUSION  C-5 C-7  Clinical Impression  Pt appears at baseline level of functioning, independent with all mobility, pt with no further PT needs at this time.    PT Assessment  Patent does not need any further PT services    Follow Up Recommendations  No PT follow up    Does the patient have the potential to tolerate intense rehabilitation      Barriers to Discharge        Equipment Recommendations  None recommended by PT    Recommendations for Other Services     Frequency      Precautions / Restrictions Precautions Precautions: Cervical Required Braces or Orthoses: Cervical Brace Cervical Brace: Hard collar Restrictions Weight Bearing Restrictions: No   Pertinent Vitals/Pain No c/o pain with mobility      Mobility  Bed Mobility Overal bed mobility: Independent Transfers Overall transfer level: Independent Ambulation/Gait Ambulation/Gait assistance: Independent Assistive device: None Gait velocity interpretation: at or above normal speed for age/gender Stairs: Yes Stairs assistance: Modified independent (Device/Increase time) Stair Management: One rail Right Number of Stairs: 4    Exercises     PT Diagnosis:    PT Problem List:   PT Treatment Interventions:       PT Goals(Current goals can be found in the care plan section) Acute Rehab PT Goals PT Goal Formulation: No goals set, d/c therapy  Visit Information  Last PT Received On: 11/22/13 Assistance Needed: +1 History of Present Illness: pt admitted for ANTERIOR CERVICAL DECOMPRESSION/DISCECTOMY FUSION  C-5 C-7       Prior Functioning  Home Living Family/patient expects to be discharged to:: Private  residence Living Arrangements: Spouse/significant other Available Help at Discharge: Family Type of Home: House Home Access: Stairs to enter Secretary/administratorntrance Stairs-Number of Steps: 3-4 Entrance Stairs-Rails: Right Home Layout: One level Home Equipment: None Prior Function Level of Independence: Independent Communication Communication: No difficulties    Cognition  Cognition Arousal/Alertness: Awake/alert Behavior During Therapy: WFL for tasks assessed/performed Overall Cognitive Status: Within Functional Limits for tasks assessed    Extremity/Trunk Assessment Upper Extremity Assessment Upper Extremity Assessment: Defer to OT evaluation Lower Extremity Assessment Lower Extremity Assessment: Overall WFL for tasks assessed Cervical / Trunk Assessment Cervical / Trunk Assessment: Normal   Balance    End of Session PT - End of Session Equipment Utilized During Treatment: Cervical collar Activity Tolerance: Patient tolerated treatment well Patient left: in bed;with call bell/phone within reach Nurse Communication: Mobility status  GP Functional Assessment Tool Used: clinical judgement Functional Limitation: Mobility: Walking and moving around Mobility: Walking and Moving Around Current Status (Y7829(G8978): 0 percent impaired, limited or restricted Mobility: Walking and Moving Around Goal Status (F6213(G8979): 0 percent impaired, limited or restricted Mobility: Walking and Moving Around Discharge Status [phone](G8980): 0 percent impaired, limited or restricted   Caleb Marshall 11/22/2013, 8:58 AM

## 2013-11-22 NOTE — Evaluation (Signed)
Occupational Therapy Evaluation and Discharge Patient Details Name: Caleb Marshall MRN: [id number] DOB: 1961/01/30 Today's Date: 11/22/2013 Time: [id number] OT Time Calculation (min): 20 min  OT Assessment / Plan / Recommendation History of present illness pt admitted for ANTERIOR CERVICAL DECOMPRESSION/DISCECTOMY FUSION  C-5 C-7   Clinical Impression   This 53 yo male admitted and underwent above presents to acute OT with all education completed and handout out post cervical surgery provided. [name].    OT Assessment  Patient does not need any further OT services    Follow Up Recommendations  No OT follow up       Equipment Recommendations  None recommended by OT          Precautions / Restrictions Precautions Precautions: Cervical Precaution Booklet Issued: Yes (comment) Required Braces or Orthoses: Cervical Brace Cervical Brace: Hard collar (to be worn per MD instruction) Restrictions Weight Bearing Restrictions: No       ADL  Transfers/Ambulation Related to ADLs: Independent ADL Comments: Went over with pt how to don/doff collar; change out pads, adjust collar horizontally and vertically (he returned demonstrated for the latter two).       Acute Rehab OT Goals Patient Stated Goal: Home today  Visit Information  Last OT Received On: 11/22/13 Assistance Needed:  (None) History of Present Illness: pt admitted for ANTERIOR CERVICAL DECOMPRESSION/DISCECTOMY FUSION  C-5 C-7       Prior Functioning     Home Living Family/patient expects to be discharged to:: Private residence Living Arrangements: Spouse/significant other Available Help at Discharge: Family Type of Home: House Home Access: Stairs to enter Secretary/administratorntrance Stairs-Number of Steps: 3-4 Entrance Stairs-Rails: Right Home Layout: One level Home Equipment: None Prior Function Level of Independence: Independent Communication Communication: No difficulties Dominant Hand: Right         Vision/Perception Vision - History Patient Visual Report: No change from baseline   Cognition  Cognition Arousal/Alertness: Awake/alert Behavior During Therapy: WFL for tasks assessed/performed Overall Cognitive Status: Within Functional Limits for tasks assessed    Extremity/Trunk Assessment Upper Extremity Assessment Upper Extremity Assessment: Overall WFL for tasks assessed              End of Session OT - End of Session Activity Tolerance: Patient tolerated treatment well Patient left: in bed;with call bell/phone within reach  GO Functional Assessment Tool Used: Clincal observation Functional Limitation: Self care Self Care Current Status (Y7829(G8987): At least 1 percent but less than 20 percent impaired, limited or restricted Self Care Goal Status (F6213(G8988): At least 1 percent but less than 20 percent impaired, limited or restricted Self Care Discharge Status [phone](G8989): At least 1 percent but less than 20 percent impaired, limited or restricted   Evette GeorgesLeonard, Caleb Marshall [phone] 11/22/2013, 1:12 PM

## 2013-11-22 NOTE — Progress Notes (Signed)
UR completed 

## 2013-11-25 ENCOUNTER — Encounter (HOSPITAL_COMMUNITY): Payer: Self-pay | Admitting: Orthopedic Surgery

## 2013-11-25 NOTE — Discharge Summary (Signed)
Patient ID: Caleb Marshall MRN: [id number] DOB/AGE: Dec 18, 1960 53 y.o.  Admit date: 11/21/2013 Discharge date: 11/25/2013  Admission Diagnoses:  Active Problems:   Neck pain   Discharge Diagnoses:  Active Problems:   Neck pain  status post Procedure(s): ANTERIOR CERVICAL DECOMPRESSION/DISCECTOMY FUSION  C-5 C-7    (2 LEVELS)   Past Medical History  Diagnosis Date  . Hypertension     takes Lotrel daily  . Hyperlipidemia     takes Lipitor daily  . GERD (gastroesophageal reflux disease)     takes omeprazole daily  . Depression     takes Wellbutrin and Celexa daily  . History of bronchitis     last time a couple of yrs ago  . Headache(784.0)   . Weakness     numbness in both hands and tingling in left leg  . Chronic back pain   . Neck pain   . Insomnia     related to pain    Surgeries: Procedure(s): ANTERIOR CERVICAL DECOMPRESSION/DISCECTOMY FUSION  C-5 C-7    (2 LEVELS)  on 11/21/2013   Consultants:    Discharged Condition: Improved  Hospital Course: Kellyn Mansfield is an 53 y.o. male who was admitted 11/21/2013 for operative treatment of <principal problem not specified>. Patient failed conservative treatments (please see the history and physical for the specifics) and had severe unremitting pain that affects sleep, daily activities and work/hobbies. After pre-op clearance, the patient was taken to the operating room on 11/21/2013 and underwent  Procedure(s): ANTERIOR CERVICAL DECOMPRESSION/DISCECTOMY FUSION  C-5 C-7    (2 LEVELS) .    Patient was given perioperative antibiotics:  Anti-infectives   Start     Dose/Rate Route Frequency Ordered Stop   11/21/13 1730  ceFAZolin (ANCEF) IVPB 1 g/50 mL premix     1 g 100 mL/hr over 30 Minutes Intravenous Every 8 hours 11/21/13 1621 11/22/13 0111   11/20/13 1429  ceFAZolin (ANCEF) IVPB 2 g/50 mL premix     2 g 100 mL/hr over 30 Minutes Intravenous 30 min pre-op 11/20/13 1429 11/21/13 0805       Patient was given  sequential compression devices and early ambulation to prevent DVT.   Patient benefited maximally from hospital stay and there were no complications. At the time of discharge, the patient was urinating/moving their bowels without difficulty, tolerating a regular diet, pain is controlled with oral pain medications and they have been cleared by PT/OT.   Recent vital signs: No data found.    Recent laboratory studies: No results found for this basename: WBC, HGB, HCT, PLT, NA, K, CL, CO2, BUN, CREATININE, GLUCOSE, PT, INR, CALCIUM, 2,  in the last 72 hours   Discharge Medications:     Medication List    STOP taking these medications       ibuprofen 200 MG tablet  Commonly known as:  ADVIL,MOTRIN      TAKE these medications       amLODipine-benazepril 5-20 MG per capsule  Commonly known as:  LOTREL  Take 1 capsule by mouth daily.     atorvastatin 10 MG tablet  Commonly known as:  LIPITOR  Take 10 mg by mouth daily.     buPROPion 150 MG 24 hr tablet  Commonly known as:  WELLBUTRIN XL  Take 150 mg by mouth daily.     CIALIS PO  Take 1 tablet by mouth daily as needed (for  erectile dysfuntion).     citalopram 40 MG tablet  Commonly  known as:  CELEXA  Take 40 mg by mouth daily.     docusate sodium 100 MG capsule  Commonly known as:  COLACE  Take 1 capsule (100 mg total) by mouth 2 (two) times daily.     methocarbamol 500 MG tablet  Commonly known as:  ROBAXIN  Take 1 tablet (500 mg total) by mouth every 6 (six) hours as needed for muscle spasms.     omeprazole 20 MG capsule  Commonly known as:  PRILOSEC  Take 20 mg by mouth daily.     ondansetron 4 MG disintegrating tablet  Commonly known as:  ZOFRAN ODT  Take 1 tablet (4 mg total) by mouth every 8 (eight) hours as needed for nausea or vomiting.     oxyCODONE-acetaminophen 5-325 MG per tablet  Commonly known as:  ROXICET  Take 1-2 tablets by mouth every 6 (six) hours as needed for severe pain.     polyethylene  glycol packet  Commonly known as:  MIRALAX / GLYCOLAX  Take 17 g by mouth daily.        Diagnostic Studies: Dg Chest 2 View  11/15/2013   CLINICAL DATA:  Hypertension  EXAM: CHEST  2 VIEW  COMPARISON:  None.  FINDINGS: There is no focal parenchymal opacity, pleural effusion, or pneumothorax. The heart and mediastinal contours are unremarkable.  There is mild degenerative disc disease throughout the thoracic spine.  IMPRESSION: No active cardiopulmonary disease.   Electronically Signed   By: Elige KoHetal  Patel   On: 11/15/2013 13:52   Dg Cervical Spine 2-3 Views  11/21/2013   CLINICAL DATA:  53 year old male undergoing spine surgery. Initial encounter.  EXAM: CERVICAL SPINE - 2-3 VIEW; DG C-ARM 1-60 MIN  COMPARISON:  Preoperative radiographs 11/15/2013.  FLUOROSCOPY TIME:  0 min and 17 seconds.  FINDINGS: 3 intraoperative fluoroscopic views of the cervical spine. C5-C6 and C6-C7 ACDF hardware placed with metallic interbody implants.  IMPRESSION: C5-C6 and C6-C7 ACDF.   Electronically Signed   By: Augusto GambleLee  Hall M.D.   On: 11/21/2013 14:57   Dg Cervical Spine 2 Or 3 Views  11/21/2013   CLINICAL DATA:  Postop neck fusion  EXAM: CERVICAL SPINE - 2-3 VIEW  COMPARISON:  November 21, 2013 fluoroscopic film  FINDINGS: Patient is status post anterior fusion of C5-C6 and C7 without malalignment. There is kyphosis of the upper cervical spine.  IMPRESSION: Patient is status post anterior fusion of C5-C6 and C7 without malalignment. There is kyphosis of the upper cervical spine.   Electronically Signed   By: Sherian ReinWei-Chen  Lin M.D.   On: 11/21/2013 12:39   Dg Cervical Spine 2 Or 3 Views  11/15/2013   CLINICAL DATA:  Patient for anterior cervical decompression C5-C7  EXAM: CERVICAL SPINE - 2-3 VIEW  COMPARISON:  None.  FINDINGS: Vertebral body height is maintained. There is reversal of the normal cervical lordosis. Loss of disc space height and endplate spurring appear worst at C5-6 and C6-7. Lung apices are clear. Carotid  atherosclerosis is noted.  IMPRESSION: No acute finding.  Multilevel spondylosis.  Reversal cervical lordosis also identified.   Electronically Signed   By: Drusilla Kannerhomas  Dalessio M.D.   On: 11/15/2013 13:20   Dg C-arm 1-60 Min  11/21/2013   CLINICAL DATA:  53 year old male undergoing spine surgery. Initial encounter.  EXAM: CERVICAL SPINE - 2-3 VIEW; DG C-ARM 1-60 MIN  COMPARISON:  Preoperative radiographs 11/15/2013.  FLUOROSCOPY TIME:  0 min and 17 seconds.  FINDINGS: 3 intraoperative fluoroscopic views of the  cervical spine. C5-C6 and C6-C7 ACDF hardware placed with metallic interbody implants.  IMPRESSION: C5-C6 and C6-C7 ACDF.   Electronically Signed   By: Augusto Gamble M.D.   On: 11/21/2013 14:57          Follow-up Information   Schedule an appointment as soon as possible for a visit with Alvy Beal, MD. (need return office visits 2 weeks postop)    Specialty:  Orthopedic Surgery   Contact information:   [address] [place] [address] [phone]       Discharge Plan:  discharge to home      Signed: Naida Sleight for Dr. Venita Lick [facility] [phone] 11/25/2013, 4:09 PM

## 2013-11-26 NOTE — Discharge Summary (Signed)
Agree with above 

## 2015-10-14 IMAGING — CR DG CERVICAL SPINE 2 OR 3 VIEWS
2 series · 2 of 2 positions shown · non-contrast
Comparison: November 21, 2013 fluoroscopic film

CLINICAL DATA: Postop neck fusion

EXAM:
CERVICAL SPINE - 2-3 VIEW

[AP]
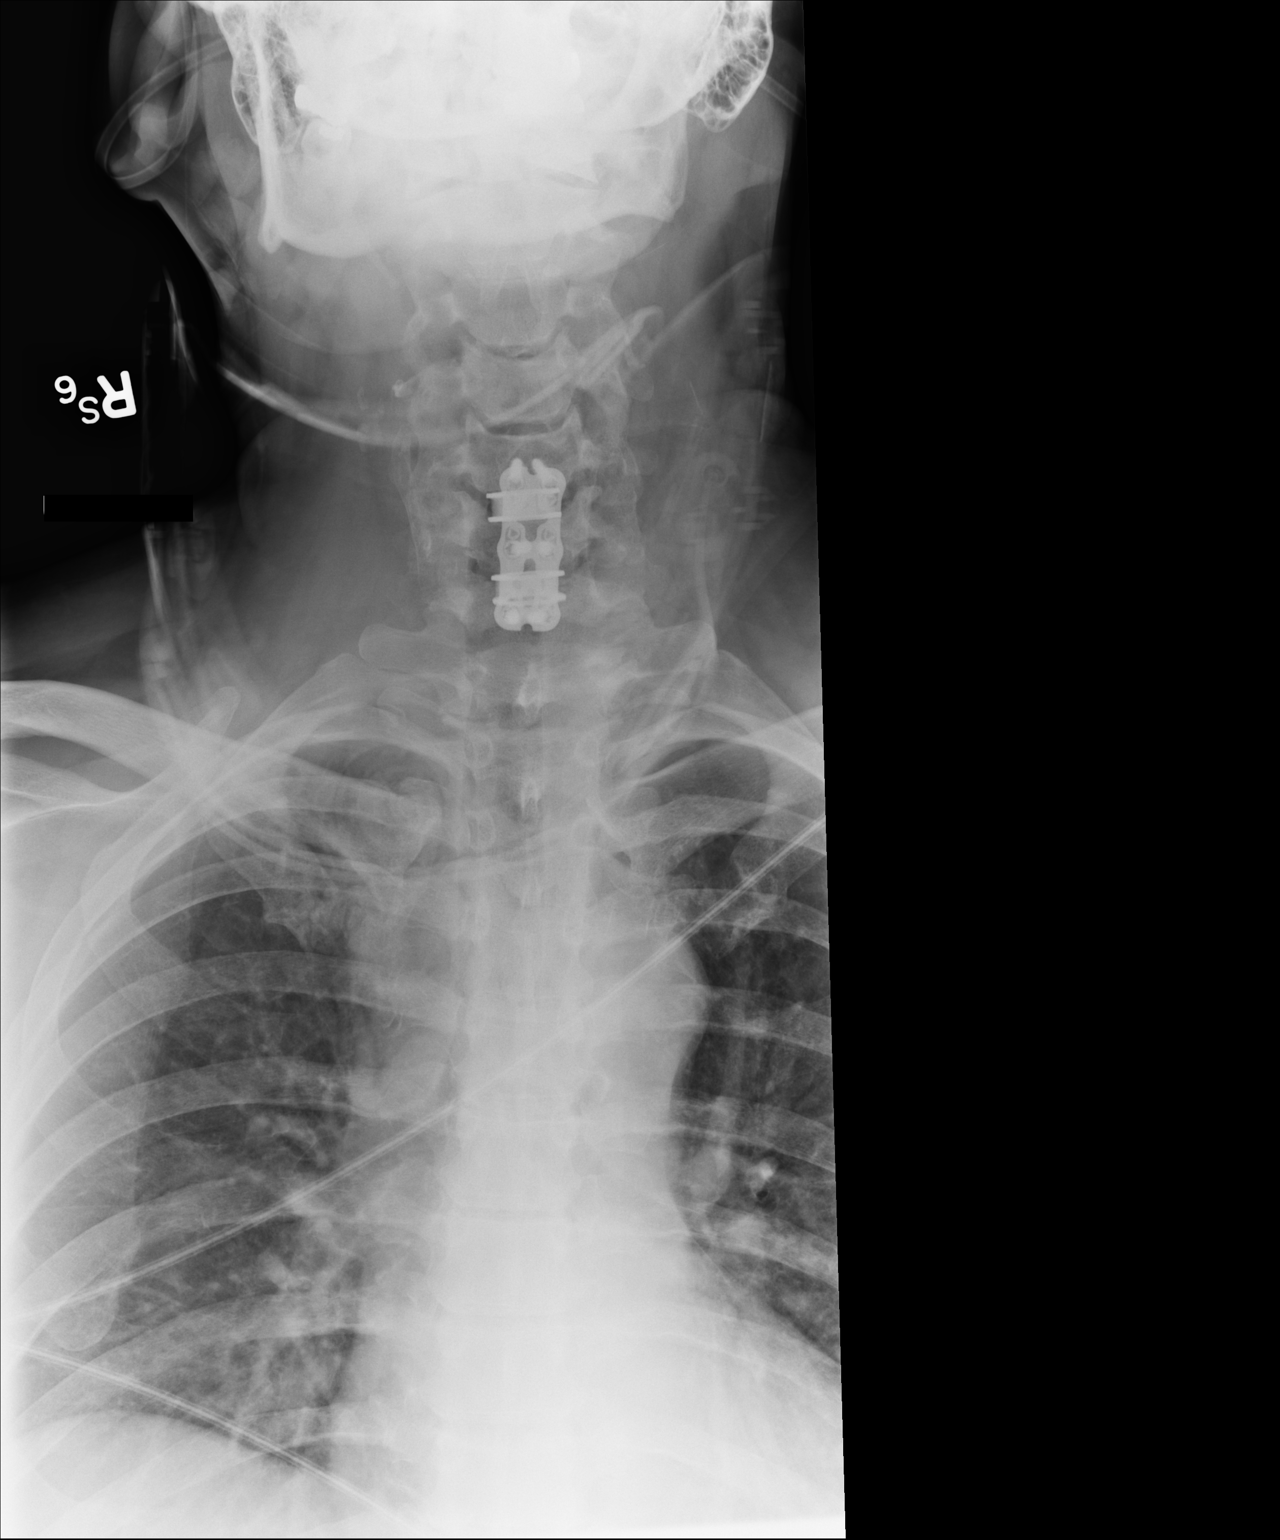

[xtable]
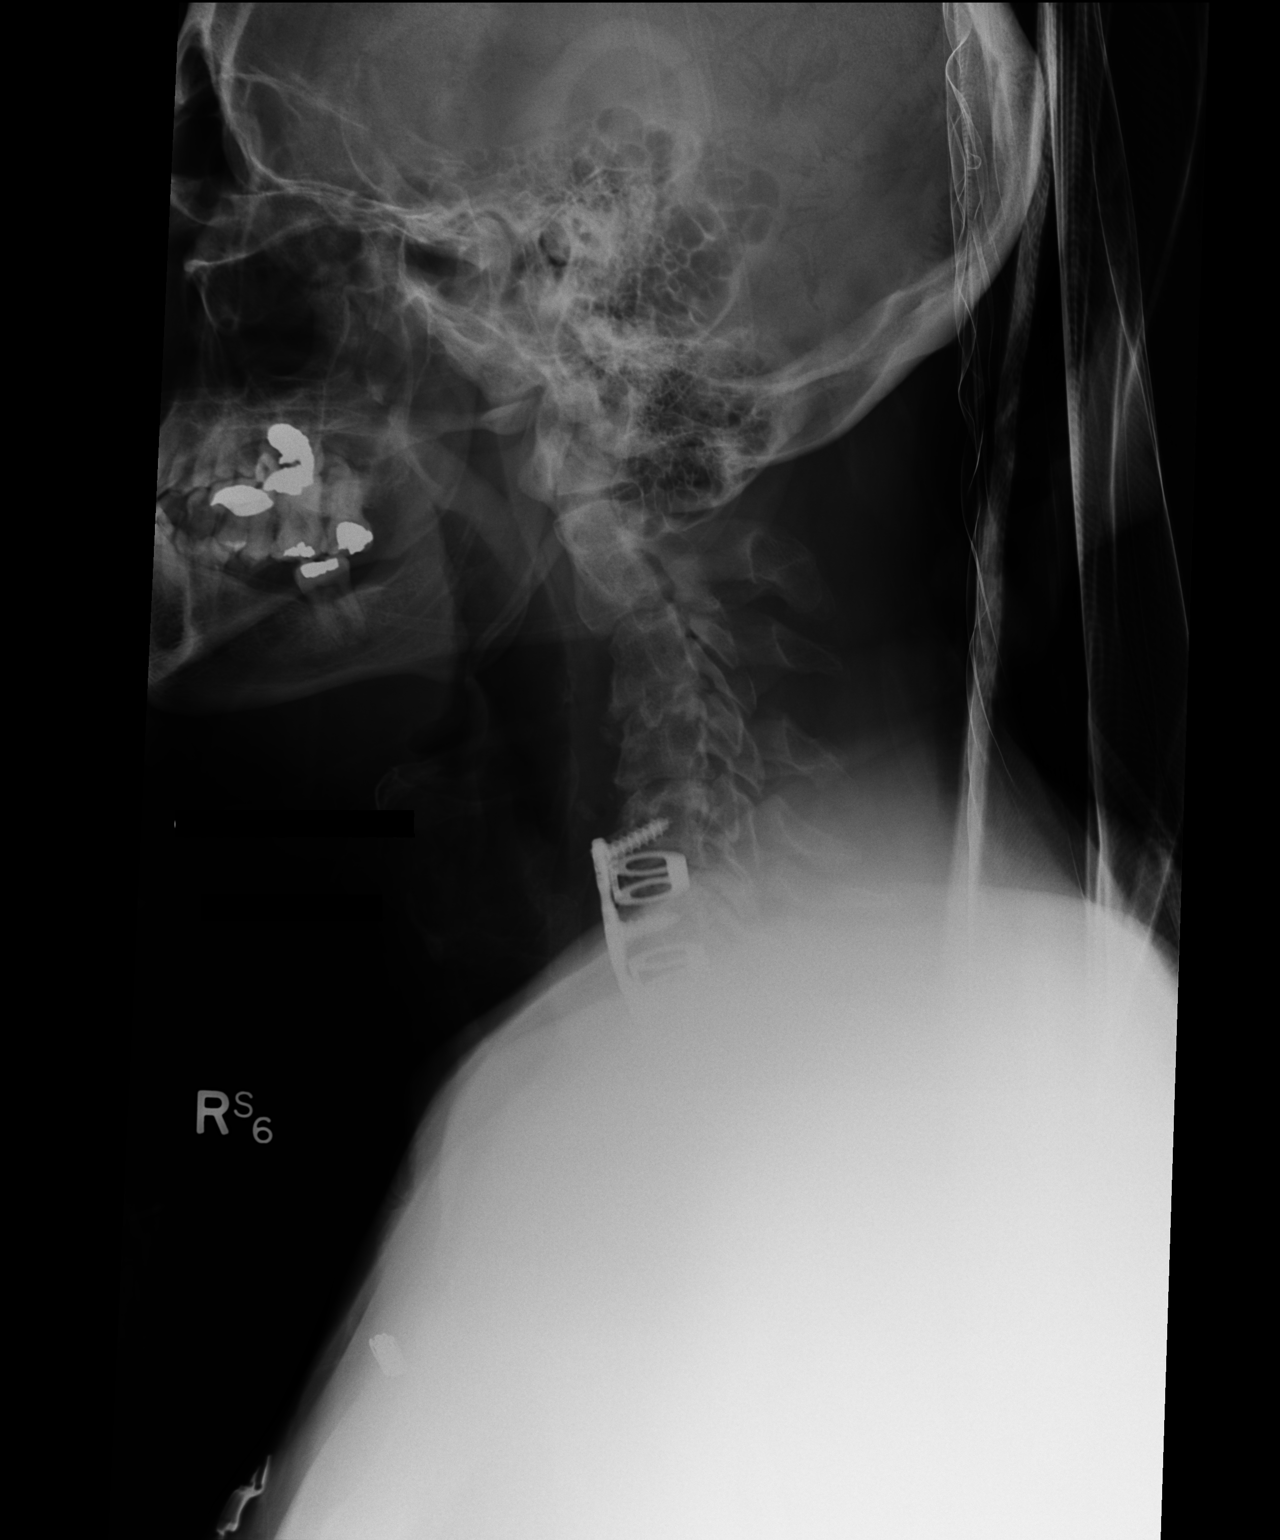

[2 of 2 positions shown; findings below may reference images not displayed]

FINDINGS: Patient is status post anterior fusion of C5-C6 and C7 without
malalignment. There is kyphosis of the upper cervical spine.
IMPRESSION: Patient is status post anterior fusion of C5-C6 and C7 without
malalignment. There is kyphosis of the upper cervical spine.
